# Patient Record
Sex: Male | Born: 1965 | Race: Black or African American | Hispanic: No | Marital: Single | Smoking: Never smoker
Health system: Southern US, Community
[De-identification: ages and names within clinical notes are randomized; demographics above are authoritative.]

## PROBLEM LIST (undated history)

## (undated) DIAGNOSIS — J302 Other seasonal allergic rhinitis: Secondary | ICD-10-CM

## (undated) DIAGNOSIS — T7840XA Allergy, unspecified, initial encounter: Secondary | ICD-10-CM

## (undated) DIAGNOSIS — I10 Essential (primary) hypertension: Secondary | ICD-10-CM

## (undated) DIAGNOSIS — Z789 Other specified health status: Secondary | ICD-10-CM

## (undated) HISTORY — PX: FOOT SURGERY: SHX648

## (undated) HISTORY — PX: NO PAST SURGERIES: SHX2092

## (undated) HISTORY — DX: Allergy, unspecified, initial encounter: T78.40XA

---

## 1998-01-30 ENCOUNTER — Emergency Department (HOSPITAL_COMMUNITY): Admission: EM | Admit: 1998-01-30 | Discharge: 1998-01-30 | Payer: Self-pay | Admitting: Emergency Medicine

## 1998-11-24 ENCOUNTER — Emergency Department (HOSPITAL_COMMUNITY): Admission: EM | Admit: 1998-11-24 | Discharge: 1998-11-24 | Payer: Self-pay | Admitting: *Deleted

## 2003-08-27 ENCOUNTER — Emergency Department (HOSPITAL_COMMUNITY): Admission: EM | Admit: 2003-08-27 | Discharge: 2003-08-27 | Payer: Self-pay | Admitting: Emergency Medicine

## 2004-01-07 ENCOUNTER — Emergency Department (HOSPITAL_COMMUNITY): Admission: EM | Admit: 2004-01-07 | Discharge: 2004-01-07 | Payer: Self-pay | Admitting: *Deleted

## 2004-01-07 ENCOUNTER — Emergency Department (HOSPITAL_COMMUNITY): Admission: EM | Admit: 2004-01-07 | Discharge: 2004-01-07 | Payer: Self-pay | Admitting: Family Medicine

## 2005-04-03 ENCOUNTER — Emergency Department (HOSPITAL_COMMUNITY): Admission: EM | Admit: 2005-04-03 | Discharge: 2005-04-03 | Payer: Self-pay | Admitting: Family Medicine

## 2012-05-18 ENCOUNTER — Emergency Department (HOSPITAL_COMMUNITY)
Admission: EM | Admit: 2012-05-18 | Discharge: 2012-05-18 | Disposition: A | Discharge status: Home / Self Care | Payer: Self-pay | Attending: Emergency Medicine | Admitting: Emergency Medicine

## 2012-05-18 ENCOUNTER — Emergency Department (HOSPITAL_COMMUNITY): Payer: Self-pay

## 2012-05-18 DIAGNOSIS — S91309A Unspecified open wound, unspecified foot, initial encounter: Secondary | ICD-10-CM | POA: Insufficient documentation

## 2012-05-18 DIAGNOSIS — S91329A Laceration with foreign body, unspecified foot, initial encounter: Secondary | ICD-10-CM

## 2012-05-18 DIAGNOSIS — Y9389 Activity, other specified: Secondary | ICD-10-CM | POA: Insufficient documentation

## 2012-05-18 DIAGNOSIS — W268XXA Contact with other sharp object(s), not elsewhere classified, initial encounter: Secondary | ICD-10-CM | POA: Insufficient documentation

## 2012-05-18 DIAGNOSIS — Z23 Encounter for immunization: Secondary | ICD-10-CM | POA: Insufficient documentation

## 2012-05-18 DIAGNOSIS — Y92009 Unspecified place in unspecified non-institutional (private) residence as the place of occurrence of the external cause: Secondary | ICD-10-CM | POA: Insufficient documentation

## 2012-05-18 MED ORDER — OXYCODONE-ACETAMINOPHEN 5-325 MG PO TABS
1.0000 | ORAL_TABLET | Freq: Once | ORAL | Status: AC
  Administered 2012-05-18: 1 via ORAL
  Filled 2012-05-18: qty 1

## 2012-05-18 MED ORDER — TETANUS-DIPHTH-ACELL PERTUSSIS 5-2.5-18.5 LF-MCG/0.5 IM SUSP
0.5000 mL | Freq: Once | INTRAMUSCULAR | Status: AC
  Administered 2012-05-18: 0.5 mL via INTRAMUSCULAR
  Filled 2012-05-18: qty 0.5

## 2012-05-18 MED ORDER — OXYCODONE-ACETAMINOPHEN 5-325 MG PO TABS: 1.0000 | ORAL_TABLET | ORAL | Status: DC | PRN

## 2012-05-18 MED ORDER — CEPHALEXIN 500 MG PO CAPS: 500.0000 mg | ORAL_CAPSULE | Freq: Four times a day (QID) | ORAL | Status: DC

## 2012-05-18 NOTE — ED Notes (Signed)
Ortho tech paged for crutches.  

## 2012-05-18 NOTE — ED Provider Notes (Signed)
History     CSN: [id number]  Arrival date & time 05/18/12  1736   First MD Initiated Contact with Patient 05/18/12 1748      Chief Complaint  Patient presents with  . Foot Injury    (Consider location/radiation/quality/duration/timing/severity/associated sxs/prior treatment) Patient is a 46 y.o. male presenting with foot injury. The history is provided by the patient.  Foot Injury  The incident occurred 1 to 2 hours ago. The incident occurred at home. The injury mechanism was an incision. The pain is present in the left foot. The pain is mild. The pain has been constant since onset. Pertinent negatives include no numbness. Associated symptoms comments: He states he was dreaming while taking a nap and kicked, hitting a glass pane causing laceration to bottom of foot. Marland Kitchen    No past medical history on file.  No past surgical history on file.  No family history on file.  History  Substance Use Topics  . Smoking status: Not on file  . Smokeless tobacco: Not on file  . Alcohol Use: Not on file      Review of Systems  Skin: Positive for wound.  Neurological: Negative for numbness.    Allergies  Review of patient's allergies indicates no known allergies.  Home Medications   Current Outpatient Rx  Name  Route  Sig  Dispense  Refill  . CETIRIZINE HCL 10 MG PO TABS   Oral   Take 10 mg by mouth daily as needed. Allergies           BP 174/91  Pulse 109  Temp 98.5 F (36.9 C) (Oral)  Resp 17  SpO2 98%  Physical Exam  Constitutional: He is oriented to person, place, and time. He appears well-developed and well-nourished. No distress.  Pulmonary/Chest: Effort normal.  Neurological: He is alert and oriented to person, place, and time.  Skin: Skin is warm and dry.       2 cm laceration plantar left foot over 4th MTP. Toes with FROM. No active bleeding. Minimal swelling. No palpable foreign bodies.  Psychiatric: He has a normal mood and affect.    ED Course   Procedures (including critical care time)  Labs Reviewed - No data to display Dg Foot Complete Left  05/18/2012  *RADIOLOGY REPORT*  Clinical Data: Foot laceration.  Trauma.  LEFT FOOT - COMPLETE 3+ VIEW  Comparison: None.  Findings: No fracture.  Anatomic alignment.  Shard of glass or debris are present along the medial aspect of the fourth toe measuring 4 mm and 5 mm each.  Old post-traumatic changes of the medial malleolus.  IMPRESSION: No acute osseous abnormality.  Debris in the medial aspect of the left fourth toe.   Original Report Authenticated By: Andreas Newport, M.D.   LACERATION REPAIR Performed by: Rodena Medin Authorized by: Langley Adie A Consent: Verbal consent obtained. Risks and benefits: risks, benefits and alternatives were discussed Consent given by: patient Patient identity confirmed: provided demographic data Prepped and Draped in normal sterile fashion Wound explored - no foreign bodies visualized, although seen on x-ray.   Laceration Location: left foot, plantar surface  Laceration Length: 3cm  No Foreign Bodies seen or palpated  Anesthesia: local infiltration  Local anesthetic: lidocaine 1% w/o epinephrine  Anesthetic total: 2 ml  Irrigation method: syringe Amount of cleaning: standard  Skin closure: 4-0 prolene  Number of sutures: 2  Technique: simple interrupted, loose closure as there are known retained foreign bodies.   Patient tolerance: Patient tolerated  the procedure well with no immediate complications.    No diagnosis found.  1. Laceration, complicated 2. Foreign body in wound  MDM  FB in wound, not found after exploration by myself and Dr. Preston Fleeting. Loose closure performed, foot wrapped in bulky dressing, crutches given. Refer to ortho        Rodena Medin, PA-C 05/18/12 2052

## 2012-05-18 NOTE — ED Notes (Addendum)
Pt states he was dreaming and kicked a window and cut below his 4th toe on his L foot. Pt arrives with dressing on L foot. Bleeding controlled.

## 2012-05-18 NOTE — ED Provider Notes (Signed)
46 year old male with a laceration of his left foot with the x-ray showing pieces of glass retained. I have probe the wound trying to find the pieces of glass, but have not been able to locate them. We will be closed loosely, he will be started on antibiotics, and he will be referred to orthopedics for exploration to remove the glass pieces.  Medical screening examination/treatment/procedure(s) were conducted as a shared visit with non-physician practitioner(s) and myself.  I personally evaluated the patient during the encounter   Dione Booze, MD 05/18/12 2040

## 2012-05-25 ENCOUNTER — Other Ambulatory Visit (HOSPITAL_COMMUNITY): Payer: Self-pay | Admitting: Orthopaedic Surgery

## 2012-05-31 ENCOUNTER — Encounter (HOSPITAL_COMMUNITY): Payer: Self-pay

## 2012-05-31 MED ORDER — CEFAZOLIN SODIUM-DEXTROSE 2-3 GM-% IV SOLR
2.0000 g | INTRAVENOUS | Status: AC
  Administered 2012-06-01: 2 g via INTRAVENOUS
  Filled 2012-05-31: qty 50

## 2012-06-01 ENCOUNTER — Encounter (HOSPITAL_COMMUNITY): Payer: Self-pay | Admitting: Anesthesiology

## 2012-06-01 ENCOUNTER — Ambulatory Visit (HOSPITAL_COMMUNITY): Payer: Self-pay | Admitting: Anesthesiology

## 2012-06-01 ENCOUNTER — Encounter (HOSPITAL_COMMUNITY)
Admission: RE | Disposition: A | Discharge status: Home / Self Care | Payer: Self-pay | Source: Ambulatory Visit | Attending: Orthopaedic Surgery

## 2012-06-01 ENCOUNTER — Encounter (HOSPITAL_COMMUNITY): Payer: Self-pay | Admitting: *Deleted

## 2012-06-01 ENCOUNTER — Ambulatory Visit (HOSPITAL_COMMUNITY)
Admission: RE | Admit: 2012-06-01 | Discharge: 2012-06-01 | Disposition: A | Discharge status: Home / Self Care | Payer: Self-pay | Source: Ambulatory Visit | Attending: Orthopaedic Surgery | Admitting: Orthopaedic Surgery

## 2012-06-01 DIAGNOSIS — M79609 Pain in unspecified limb: Secondary | ICD-10-CM | POA: Insufficient documentation

## 2012-06-01 DIAGNOSIS — W268XXA Contact with other sharp object(s), not elsewhere classified, initial encounter: Secondary | ICD-10-CM | POA: Insufficient documentation

## 2012-06-01 DIAGNOSIS — S90852A Superficial foreign body, left foot, initial encounter: Secondary | ICD-10-CM

## 2012-06-01 DIAGNOSIS — S91309A Unspecified open wound, unspecified foot, initial encounter: Secondary | ICD-10-CM | POA: Insufficient documentation

## 2012-06-01 HISTORY — PX: I & D EXTREMITY: SHX5045

## 2012-06-01 HISTORY — DX: Other specified health status: Z78.9

## 2012-06-01 HISTORY — DX: Other seasonal allergic rhinitis: J30.2

## 2012-06-01 LAB — CBC
MCV: 94.7 fL (ref 78.0–100.0)
Platelets: 251 10*3/uL (ref 150–400)
RBC: 4.53 MIL/uL (ref 4.22–5.81)
WBC: 7.2 10*3/uL (ref 4.0–10.5)

## 2012-06-01 LAB — SURGICAL PCR SCREEN: MRSA, PCR: NEGATIVE

## 2012-06-01 SURGERY — IRRIGATION AND DEBRIDEMENT EXTREMITY
Anesthesia: Monitor Anesthesia Care | Site: Foot | Laterality: Left | Wound class: Clean

## 2012-06-01 MED ORDER — FENTANYL CITRATE 0.05 MG/ML IJ SOLN
INTRAMUSCULAR | Status: DC | PRN
  Administered 2012-06-01 (×5): 50 ug via INTRAVENOUS

## 2012-06-01 MED ORDER — MIDAZOLAM HCL 5 MG/5ML IJ SOLN
INTRAMUSCULAR | Status: DC | PRN
  Administered 2012-06-01: 2 mg via INTRAVENOUS

## 2012-06-01 MED ORDER — MUPIROCIN 2 % EX OINT: TOPICAL_OINTMENT | Freq: Two times a day (BID) | CUTANEOUS | Status: DC

## 2012-06-01 MED ORDER — LACTATED RINGERS IV SOLN
INTRAVENOUS | Status: DC | PRN
  Administered 2012-06-01: 16:00:00 via INTRAVENOUS

## 2012-06-01 MED ORDER — LACTATED RINGERS IV SOLN
INTRAVENOUS | Status: DC
  Administered 2012-06-01: 15:00:00 via INTRAVENOUS

## 2012-06-01 MED ORDER — MUPIROCIN 2 % EX OINT
TOPICAL_OINTMENT | CUTANEOUS | Status: AC
  Administered 2012-06-01: 1 via NASAL
  Filled 2012-06-01: qty 22

## 2012-06-01 MED ORDER — PROPOFOL INFUSION 10 MG/ML OPTIME
INTRAVENOUS | Status: DC | PRN
  Administered 2012-06-01: 100 ug/kg/min via INTRAVENOUS

## 2012-06-01 MED ORDER — HYDROMORPHONE HCL PF 1 MG/ML IJ SOLN: 0.2500 mg | INTRAMUSCULAR | Status: DC | PRN

## 2012-06-01 MED ORDER — ONDANSETRON HCL 4 MG/2ML IJ SOLN: 4.0000 mg | Freq: Once | INTRAMUSCULAR | Status: DC | PRN

## 2012-06-01 MED ORDER — SODIUM CHLORIDE 0.9 % IR SOLN
Status: DC | PRN
  Administered 2012-06-01: 1000 mL

## 2012-06-01 MED ORDER — OXYCODONE-ACETAMINOPHEN 5-325 MG PO TABS: 1.0000 | ORAL_TABLET | ORAL | Status: DC | PRN

## 2012-06-01 MED ORDER — LIDOCAINE HCL (CARDIAC) 20 MG/ML IV SOLN
INTRAVENOUS | Status: DC | PRN
  Administered 2012-06-01: 30 mg via INTRAVENOUS

## 2012-06-01 SURGICAL SUPPLY — 55 items
BANDAGE CONFORM 3  STR LF (GAUZE/BANDAGES/DRESSINGS) IMPLANT
BANDAGE ELASTIC 3 VELCRO ST LF (GAUZE/BANDAGES/DRESSINGS) IMPLANT
BANDAGE GAUZE ELAST BULKY 4 IN (GAUZE/BANDAGES/DRESSINGS) ×2 IMPLANT
BLADE SURG 10 STRL SS (BLADE) ×2 IMPLANT
BNDG COHESIVE 1X5 TAN STRL LF (GAUZE/BANDAGES/DRESSINGS) IMPLANT
BNDG COHESIVE 4X5 TAN STRL (GAUZE/BANDAGES/DRESSINGS) ×2 IMPLANT
BNDG COHESIVE 6X5 TAN STRL LF (GAUZE/BANDAGES/DRESSINGS) ×4 IMPLANT
BNDG GAUZE STRTCH 6 (GAUZE/BANDAGES/DRESSINGS) ×6 IMPLANT
CLOTH BEACON ORANGE TIMEOUT ST (SAFETY) ×2 IMPLANT
CORDS BIPOLAR (ELECTRODE) IMPLANT
COVER SURGICAL LIGHT HANDLE (MISCELLANEOUS) ×2 IMPLANT
CUFF TOURNIQUET SINGLE 18IN (TOURNIQUET CUFF) ×2 IMPLANT
CUFF TOURNIQUET SINGLE 24IN (TOURNIQUET CUFF) IMPLANT
CUFF TOURNIQUET SINGLE 34IN LL (TOURNIQUET CUFF) IMPLANT
CUFF TOURNIQUET SINGLE 44IN (TOURNIQUET CUFF) IMPLANT
DRAPE ORTHO SPLIT 77X108 STRL (DRAPES) ×4
DRAPE SURG 17X23 STRL (DRAPES) IMPLANT
DRAPE SURG ORHT 6 SPLT 77X108 (DRAPES) ×2 IMPLANT
DRAPE U-SHAPE 47X51 STRL (DRAPES) ×2 IMPLANT
DURAPREP 26ML APPLICATOR (WOUND CARE) ×2 IMPLANT
ELECT CAUTERY BLADE 6.4 (BLADE) IMPLANT
ELECT REM PT RETURN 9FT ADLT (ELECTROSURGICAL)
ELECTRODE REM PT RTRN 9FT ADLT (ELECTROSURGICAL) IMPLANT
GAUZE XEROFORM 1X8 LF (GAUZE/BANDAGES/DRESSINGS) ×2 IMPLANT
GLOVE BIOGEL PI IND STRL 8 (GLOVE) ×2 IMPLANT
GLOVE BIOGEL PI INDICATOR 8 (GLOVE) ×2
GLOVE ORTHO TXT STRL SZ7.5 (GLOVE) ×2 IMPLANT
GOWN PREVENTION PLUS LG XLONG (DISPOSABLE) IMPLANT
GOWN PREVENTION PLUS XLARGE (GOWN DISPOSABLE) ×2 IMPLANT
GOWN STRL NON-REIN LRG LVL3 (GOWN DISPOSABLE) ×2 IMPLANT
HANDPIECE INTERPULSE COAX TIP (DISPOSABLE)
KIT BASIN OR (CUSTOM PROCEDURE TRAY) ×2 IMPLANT
KIT ROOM TURNOVER OR (KITS) ×2 IMPLANT
MANIFOLD NEPTUNE II (INSTRUMENTS) ×2 IMPLANT
NS IRRIG 1000ML POUR BTL (IV SOLUTION) ×2 IMPLANT
PACK ORTHO EXTREMITY (CUSTOM PROCEDURE TRAY) ×2 IMPLANT
PAD ARMBOARD 7.5X6 YLW CONV (MISCELLANEOUS) ×4 IMPLANT
PADDING CAST ABS 4INX4YD NS (CAST SUPPLIES) ×2
PADDING CAST ABS COTTON 4X4 ST (CAST SUPPLIES) ×2 IMPLANT
PADDING CAST COTTON 6X4 STRL (CAST SUPPLIES) ×2 IMPLANT
SET HNDPC FAN SPRY TIP SCT (DISPOSABLE) IMPLANT
SPONGE GAUZE 4X4 12PLY (GAUZE/BANDAGES/DRESSINGS) ×2 IMPLANT
SPONGE LAP 18X18 X RAY DECT (DISPOSABLE) ×2 IMPLANT
STOCKINETTE IMPERVIOUS 9X36 MD (GAUZE/BANDAGES/DRESSINGS) ×2 IMPLANT
SUT ETHILON 2 0 FS 18 (SUTURE) ×6 IMPLANT
SUT ETHILON 3 0 PS 1 (SUTURE) ×4 IMPLANT
SYR CONTROL 10ML LL (SYRINGE) IMPLANT
TOWEL OR 17X24 6PK STRL BLUE (TOWEL DISPOSABLE) ×2 IMPLANT
TOWEL OR 17X26 10 PK STRL BLUE (TOWEL DISPOSABLE) ×2 IMPLANT
TUBE ANAEROBIC SPECIMEN COL (MISCELLANEOUS) IMPLANT
TUBE CONNECTING 12X1/4 (SUCTIONS) ×2 IMPLANT
TUBE FEEDING 5FR 15 INCH (TUBING) IMPLANT
UNDERPAD 30X30 INCONTINENT (UNDERPADS AND DIAPERS) ×2 IMPLANT
WATER STERILE IRR 1000ML POUR (IV SOLUTION) ×2 IMPLANT
YANKAUER SUCT BULB TIP NO VENT (SUCTIONS) ×2 IMPLANT

## 2012-06-01 NOTE — Transfer of Care (Signed)
Immediate Anesthesia Transfer of Care Note  Patient: Jeremy Marsh  Procedure(s) Performed: Procedure(s) (LRB) with comments: IRRIGATION AND DEBRIDEMENT EXTREMITY (Left) - Removal of glass left foot  Patient Location: PACU  Anesthesia Type:MAC and Regional  Level of Consciousness: awake, alert  and oriented  Airway & Oxygen Therapy: Patient Spontanous Breathing and Patient connected to nasal cannula oxygen  Post-op Assessment: Report given to PACU RN, Post -op Vital signs reviewed and stable and Patient moving all extremities  Post vital signs: Reviewed and stable  Complications: No apparent anesthesia complications

## 2012-06-01 NOTE — Anesthesia Preprocedure Evaluation (Addendum)
Anesthesia Evaluation  Patient identified by MRN, date of birth, ID band Patient awake    Reviewed: Allergy & Precautions, H&P , NPO status , Patient's Chart, lab work & pertinent test results  Airway Mallampati: II TM Distance: >3 FB Neck ROM: Full    Dental  (+) Teeth Intact Prominent front teeth.:   Pulmonary          Cardiovascular Rhythm:regular Rate:Normal     Neuro/Psych    GI/Hepatic   Endo/Other    Renal/GU      Musculoskeletal   Abdominal   Peds  Hematology   Anesthesia Other Findings   Reproductive/Obstetrics                          Anesthesia Physical Anesthesia Plan  ASA: II  Anesthesia Plan: MAC and Regional   Post-op Pain Management: MAC Combined w/ Regional for Post-op pain   Induction: Intravenous  Airway Management Planned: Simple Face Mask  Additional Equipment:   Intra-op Plan:   Post-operative Plan:   Informed Consent: I have reviewed the patients History and Physical, chart, labs and discussed the procedure including the risks, benefits and alternatives for the proposed anesthesia with the patient or authorized representative who has indicated his/her understanding and acceptance.   Dental advisory given  Plan Discussed with: CRNA, Anesthesiologist and Surgeon  Anesthesia Plan Comments:        Anesthesia Quick Evaluation

## 2012-06-01 NOTE — H&P (Signed)
Jeremy Marsh is an 47 y.o. male.   Chief Complaint:  Retained glass in left foot HPI:   48 yo male who accidentally kicked a window with his left foot while asleep when he was dreaming.  Sustained a laceration to his left foot between his 3rd and 4th toes.  X-rays show 2 glass fragments still in his left foot near the 4th toe.  He wishes to have these removed.  Past Medical History  Diagnosis Date  . Seasonal allergies   . No pertinent past medical history     Past Surgical History  Procedure Date  . No past surgeries     History reviewed. No pertinent family history. Social History:  reports that he has never smoked. He does not have any smokeless tobacco history on file. He reports that he drinks alcohol. He reports that he does not use illicit drugs.  Allergies: No Known Allergies  Medications Prior to Admission  Medication Sig Dispense Refill  . cephALEXin (KEFLEX) 500 MG capsule Take 1 capsule (500 mg total) by mouth 4 (four) times daily.  20 capsule  0  . oxyCODONE-acetaminophen (PERCOCET/ROXICET) 5-325 MG per tablet Take 1-2 tablets by mouth every 4 (four) hours as needed for pain.  15 tablet  0  . cetirizine (ZYRTEC) 10 MG tablet Take 10 mg by mouth daily as needed. Allergies        Results for orders placed during the hospital encounter of 06/01/12 (from the past 48 hour(s))  CBC     Status: Normal   Collection Time   06/01/12  1:42 PM      Component Value Range Comment   WBC 7.2  4.0 - 10.5 K/uL    RBC 4.53  4.22 - 5.81 MIL/uL    Hemoglobin 14.5  13.0 - 17.0 g/dL    HCT 78.2  95.6 - 21.3 %    MCV 94.7  78.0 - 100.0 fL    MCH 32.0  26.0 - 34.0 pg    MCHC 33.8  30.0 - 36.0 g/dL    RDW 08.6  57.8 - 46.9 %    Platelets 251  150 - 400 K/uL    No results found.  Review of Systems  All other systems reviewed and are negative.    Blood pressure 141/96, pulse 91, temperature 98.3 F (36.8 C), temperature source Oral, resp. rate 20, height 6' 1.5" (1.867 m), weight  112.4 kg (247 lb 12.8 oz), SpO2 97.00%. Physical Exam  Constitutional: He is oriented to person, place, and time. He appears well-developed and well-nourished.  HENT:  Head: Normocephalic and atraumatic.  Eyes: EOM are normal. Pupils are equal, round, and reactive to light.  Neck: Normal range of motion. Neck supple.  Cardiovascular: Normal rate and regular rhythm.   Respiratory: Effort normal and breath sounds normal.  GI: Soft. Bowel sounds are normal.  Musculoskeletal:       Feet:  Neurological: He is alert and oriented to person, place, and time.  Skin: Skin is warm and dry.  Psychiatric: He has a normal mood and affect.     Assessment/Plan Retained small glass pieces in left foot near 4th toe 1) to the OR as an outpatient for removal of the retained foreign bodies from his left foot as an outpatient  Jeremy Marsh Y 06/01/2012, 2:35 PM

## 2012-06-01 NOTE — Anesthesia Procedure Notes (Signed)
Anesthesia Regional Block:  Ankle block  Pre-Anesthetic Checklist: ,, timeout performed, Correct Patient, Correct Site, Correct Laterality, Correct Procedure, Correct Position, site marked, Risks and benefits discussed,  Surgical consent,  Pre-op evaluation,  At surgeon's request and post-op pain management  Laterality: Left  Prep: Maximum Sterile Barrier Precautions used, chloraprep and alcohol swabs       Needles:  Injection technique: Single-shot      Needle Gauge: 25 and 25 G  Needle insertion depth: 4 cm   Additional Needles: Ankle block Narrative:  Start time: 06/01/2012 3:30 PM End time: 06/01/2012 3:40 PM Injection made incrementally with aspirations every 5 mL.  Performed by: Personally  Anesthesiologist: Maren Beach MD  Additional Notes: Pt accept procedure and risks. 40cc ( 20cc 2% Lidocaine and 20cc 0.5% Marcaine ) w/o difficulty. Ant/Post Tibial and Sural N .  GES

## 2012-06-01 NOTE — Brief Op Note (Signed)
06/01/2012  4:40 PM  PATIENT:  Jeremy Marsh  47 y.o. male  PRE-OPERATIVE DIAGNOSIS:  Foreign body (glass) left foot  POST-OPERATIVE DIAGNOSIS: No Foreign body (glass) left foot  PROCEDURE:  Exploration of left foot wound  SURGEON:  Surgeon(s) and Role:    * Kathryne Hitch, MD - Primary  PHYSICIAN ASSISTANT:   ASSISTANTS: none   ANESTHESIA:   regional  EBL:  Total I/O In: 600 [I.V.:600] Out: -   BLOOD ADMINISTERED:none  DRAINS: none   LOCAL MEDICATIONS USED:  NONE  SPECIMEN:  No Specimen  DISPOSITION OF SPECIMEN:  N/A  COUNTS:  YES  TOURNIQUET:  * No tourniquets in log *  DICTATION: .Other Dictation: Dictation Number [id number]  PLAN OF CARE: Discharge to home after PACU  PATIENT DISPOSITION:  PACU - hemodynamically stable.   Delay start of Pharmacological VTE agent (>24hrs) due to surgical blood loss or risk of bleeding: not applicable

## 2012-06-01 NOTE — Anesthesia Postprocedure Evaluation (Signed)
  Anesthesia Post-op Note  Patient: Jeremy Marsh  Procedure(s) Performed: Procedure(s) (LRB) with comments: IRRIGATION AND DEBRIDEMENT EXTREMITY (Left) - Removal of glass left foot  Patient Location: PACU  Anesthesia Type:Regional  Level of Consciousness: awake, alert , oriented and patient cooperative  Airway and Oxygen Therapy: Patient Spontanous Breathing  Post-op Pain: none  Post-op Assessment: Post-op Vital signs reviewed, Patient's Cardiovascular Status Stable, Respiratory Function Stable, Patent Airway, No signs of Nausea or vomiting and Pain level controlled  Post-op Vital Signs: stable  Complications: No apparent anesthesia complications

## 2012-06-02 ENCOUNTER — Encounter (HOSPITAL_COMMUNITY): Payer: Self-pay | Admitting: Orthopaedic Surgery

## 2012-06-02 MED ORDER — LIDOCAINE HCL (PF) 2 % IJ SOLN
INTRAMUSCULAR | Status: DC | PRN
  Administered 2012-06-01: 20 mL

## 2012-06-02 MED ORDER — BUPIVACAINE HCL (PF) 0.5 % IJ SOLN
INTRAMUSCULAR | Status: DC | PRN
  Administered 2012-06-01: 20 mL

## 2012-06-02 NOTE — Op Note (Signed)
[name], Jeremy Marsh               ACCOUNT NO.:  [id number]  MEDICAL RECORD NO.:  [id number]  LOCATION:  [facility]                         FACILITY:  [facility]  PHYSICIAN:  Vanita Panda. Magnus Ivan, M.D.DATE OF BIRTH:  [birth year]  DATE OF PROCEDURE: DATE OF DISCHARGE:  06/01/2012                              OPERATIVE REPORT   PREOPERATIVE DIAGNOSIS:  Left foot wound with retained glass.  POSTOPERATIVE DIAGNOSIS:  Left foot wound.  PROCEDURE:  FINDINGS:  Explore the wound within and unable to find retained glass and no x-ray evidence under fluoroscopic guidance of glass.  SURGEON:  Vanita Panda. Magnus Ivan, M.D.  ANESTHESIA:  Local left ankle block.  BLOOD LOSS:  Minimal.  COMPLICATIONS:  None.  INDICATIONS:  [name] is a 47 year old gentleman who had some type of nightmare when he was sleeping and kicked the glass window.  He was seen a few weeks ago in the emergency room, and had a laceration found between his 3rd and 4th toes.  X-ray showed retained glass near the 4th toe and apparently the ER was unable to find the glass, per the patient and their notes and irrigated the wound and placed a single suture.  He then came to my office, I could not fill any glass.  He is having a lot of pain.  I recommended he just let the wound to heal and leave the glass in place because it looked like it was rounded off.  He did not like this and wished to have the glass removed.  In total, this would be quite difficult to do this, except done under operative environment and I have given no guarantees that we could find the glass and the potential of causing more damage and even infection to his foot.  He did wish to proceed with surgery.  PROCEDURE DESCRIPTION:  After informed consent was obtained, appropriate left foot was marked.  Anesthesia obtained to a local ankle block.  He was brought to the operating room, placed supine on the operating table. His left foot was then prepped and draped  with DuraPrep and sterile drapes.  Time-out was called and identified the correct patient, correct left foot.  I then removed 1 suture that was in the foot just on the plantar surface between the 3rd and 4th toes and open of the wound in its entirety between the 3rd and 4th toes.  I explored the wound under loupe optic guidance and even using the mini C-arm unit and I could not find any glass at all and under direct fluoroscopy I did not see any shards of glass under multiple views.  I then thoroughly irrigated the wounds and closed the incision with interrupted 3-0 nylon suture.  I placed Xeroform well-padded sterile dressing.  He was taken to recovery room in stable condition. All final counts were correct.  There were no complications noted.     Vanita Panda. Magnus Ivan, M.D.     CYB/MEDQ  D:  06/01/2012  T:  06/02/2012  Job:  [id number]

## 2012-06-02 NOTE — Addendum Note (Signed)
Addendum  created 06/02/12 1207 by Adair Laundry, CRNA   Modules edited:Anesthesia Flowsheet, Anesthesia Medication Administration, Charges VN

## 2012-06-02 NOTE — Addendum Note (Signed)
Addendum  created 06/02/12 1207 by Adair Laundry, CRNA   Modules edited:Anesthesia Medication Administration, Charges VN

## 2014-08-03 ENCOUNTER — Ambulatory Visit (INDEPENDENT_AMBULATORY_CARE_PROVIDER_SITE_OTHER): Payer: Self-pay | Admitting: Family Medicine

## 2014-08-03 VITALS — BP 124/80 | HR 87 | Temp 98.2°F | Resp 18 | Ht 71.5 in | Wt 267.0 lb

## 2014-08-03 DIAGNOSIS — R21 Rash and other nonspecific skin eruption: Secondary | ICD-10-CM

## 2014-08-03 LAB — POCT CBC
GRANULOCYTE PERCENT: 65.7 % (ref 37–80)
HEMATOCRIT: 44.7 % (ref 43.5–53.7)
HEMOGLOBIN: 14.2 g/dL (ref 14.1–18.1)
LYMPH, POC: 2.5 (ref 0.6–3.4)
MCH: 30.6 pg (ref 27–31.2)
MCHC: 31.8 g/dL (ref 31.8–35.4)
MCV: 96.1 fL (ref 80–97)
MID (CBC): 0.1 (ref 0–0.9)
MPV: 8.9 fL (ref 0–99.8)
PLATELET COUNT, POC: 236 10*3/uL (ref 142–424)
POC Granulocyte: 5.1 (ref 2–6.9)
POC LYMPH PERCENT: 32.4 %L (ref 10–50)
POC MID %: 1.9 % (ref 0–12)
RBC: 4.65 M/uL — AB (ref 4.69–6.13)
RDW, POC: 13.8 %
WBC: 7.7 10*3/uL (ref 4.6–10.2)

## 2014-08-03 MED ORDER — PREDNISONE 20 MG PO TABS: ORAL_TABLET | ORAL | Status: DC

## 2014-08-03 NOTE — Progress Notes (Signed)
MRN: [id number] DOB: 1966-01-30  Subjective:   Jeremy Marsh is a 49 y.o. male presenting for chief complaint of Rash  Reports 5 days history of rash throughout his body. Rash started on his back and has spread to neck, torso, upper arms, thighs. Rash is mildly itchy. No one else at home has similar rash. Had an insect bite to back of his head ~1 week ago while outside, now resolved after expressing it. Denies fevers, oral lesions, rash on palms or soles of feet, drainage of pus or blood, oozing, pain, numbness or tingling, chest tightness, shob, wheezing, facial swelling, oral swelling, throat closing. Also denies itchy or watery eyes, sinus pain, sinus congestion, throat pain, cough. Has not come into contact with poisonous plants, spent time outdoors camping. Has a history of hives in 2015, resolved with hydrocortisone cream. No smoking, 1-2 alcohol drinks each week. Denies any other aggravating or relieving factors, no other questions or concerns.  Tyde has a current medication list which includes the following prescription(s): cetirizine. He has No Known Allergies.  Telesforo  has a past medical history of Seasonal allergies; No pertinent past medical history; and Allergy. Also  has past surgical history that includes No past surgeries; I&D extremity (06/01/2012); and Foot surgery.  ROS As in subjective.  Objective:   Vitals: BP 124/80 mmHg  Pulse 87  Temp(Src) 98.2 F (36.8 C) (Oral)  Resp 18  Ht 5' 11.5" (1.816 m)  Wt 267 lb (121.11 kg)  BMI 36.72 kg/m2  SpO2 99%  Physical Exam  Constitutional: He is oriented to person, place, and time and well-developed, well-nourished, and in no distress.  HENT:  No oral lesions. Mucous membranes pink and moist.  Eyes: Conjunctivae are normal. Right eye exhibits no discharge. Left eye exhibits no discharge. No scleral icterus.  Cardiovascular: Normal rate.   Pulmonary/Chest: Effort normal.  Neurological: He is alert and oriented to  person, place, and time.  Skin: Skin is warm and dry. Rash (diffuse papular rash with many follicular/small pustules worst over back with multiple patches, rash is also over upper extremities, thighs, neck, a few lesions over face) noted. No erythema.   Results for orders placed or performed in visit on 08/03/14 (from the past 24 hour(s))  POCT CBC     Status: Abnormal   Collection Time: 08/03/14  5:35 PM  Result Value Ref Range   WBC 7.7 4.6 - 10.2 K/uL   Lymph, poc 2.5 0.6 - 3.4   POC LYMPH PERCENT 32.4 10 - 50 %L   MID (cbc) 0.1 0 - 0.9   POC MID % 1.9 0 - 12 %M   POC Granulocyte 5.1 2 - 6.9   Granulocyte percent 65.7 37 - 80 %G   RBC 4.65 (A) 4.69 - 6.13 M/uL   Hemoglobin 14.2 14.1 - 18.1 g/dL   HCT, POC 40.9 81.1 - 53.7 %   MCV 96.1 80 - 97 fL   MCH, POC 30.6 27 - 31.2 pg   MCHC 31.8 31.8 - 35.4 g/dL   RDW, POC 91.4 %   Platelet Count, POC 236 142 - 424 K/uL   MPV 8.9 0 - 99.8 fL   Assessment and Plan :   1. Rash and nonspecific skin eruption - Unclear etiology, cbc reassuring for ruling out infectious process, will try a steroid course for 5 days. - Counseled on worsening symptoms and advised to return to clinic if facial swelling, shob, wheezing develops  Wallis Bamberg, PA-C [facility] [phone] 08/03/2014 5:08 PM

## 2014-08-03 NOTE — Progress Notes (Signed)
Discussed the patient with Wallis BambergMario Mani PA-C.  I examined also. The rash has a few areas that look like welt formation. However most of the rash is very fine follicular looking spots. This is diffuse on the trunk where it started, but also has on the face. There is a little black. On the right side of the for head is a whelp like bump about a centimeter in diameter. Rash extends on both extremities, not on the palms. No interdigital lesions. This is not pruritic. There are no oral lesions. Agree that this is an atypical rash. Do not believe it is a major bacterial infection, and the CBC is normal. It could represent a viral exanthem though it could represent a allergic process with itching in by having taken an antihistamine. Will give her a brief burst of steroids and see him back in all worse. Cautioned about worsening allergic reaction type symptoms. PA treatment and plan agreed upon.

## 2014-08-03 NOTE — Patient Instructions (Signed)

## 2015-10-08 ENCOUNTER — Telehealth: Payer: Self-pay

## 2015-10-09 NOTE — Telephone Encounter (Signed)
ERROR

## 2016-02-11 ENCOUNTER — Ambulatory Visit (INDEPENDENT_AMBULATORY_CARE_PROVIDER_SITE_OTHER): Payer: Self-pay | Admitting: Urgent Care

## 2016-02-11 VITALS — BP 152/98 | HR 94 | Temp 98.3°F | Resp 17 | Ht 71.5 in | Wt 264.0 lb

## 2016-02-11 DIAGNOSIS — H109 Unspecified conjunctivitis: Secondary | ICD-10-CM

## 2016-02-11 DIAGNOSIS — H1089 Other conjunctivitis: Secondary | ICD-10-CM

## 2016-02-11 DIAGNOSIS — A499 Bacterial infection, unspecified: Secondary | ICD-10-CM

## 2016-02-11 DIAGNOSIS — R030 Elevated blood-pressure reading, without diagnosis of hypertension: Secondary | ICD-10-CM

## 2016-02-11 DIAGNOSIS — H578 Other specified disorders of eye and adnexa: Secondary | ICD-10-CM

## 2016-02-11 DIAGNOSIS — H5789 Other specified disorders of eye and adnexa: Secondary | ICD-10-CM

## 2016-02-11 DIAGNOSIS — I10 Essential (primary) hypertension: Secondary | ICD-10-CM

## 2016-02-11 MED ORDER — ERYTHROMYCIN 5 MG/GM OP OINT: 1.0000 "application " | TOPICAL_OINTMENT | Freq: Four times a day (QID) | OPHTHALMIC | 0 refills | Status: AC

## 2016-02-11 NOTE — Progress Notes (Signed)
    MRN: [id number] DOB: 09/19/1965  Subjective:   Jeremy Marsh is a 50 y.o. male presenting for chief complaint of Eye Burn (Right. Since Saturday. )  Reports 4 day history of right red eye. Eye is also itching, burning. Continues to rub his eye. His vision has generally been blurry in his left but not his right eye. He has been working with his PCP and ophthalmologist for blurred vision and HTN. Denies fever, eye trauma, photosensitivity, pain with eye movement, sinus pain, congestion, ear pain, ear drainage, cough, sore throat. 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 Denies wearing glasses or contacts.  [name] has a current medication list which includes the following prescription(s): amlodipine, cetirizine, and prednisone. Also has No Known Allergies.  [name]  has a past medical history of Allergy; No pertinent past medical history; and Seasonal allergies. Also  has a past surgical history that includes No past surgeries; I&D extremity (06/01/2012); and Foot surgery.  Objective:   Vitals: BP (!) 152/98 (BP Location: Right Arm, Patient Position: Sitting, Cuff Size: Large)   Pulse 94   Temp 98.3 F (36.8 C) (Oral)   Resp 17   Ht 5' 11.5" (1.816 m)   Wt 264 lb (119.7 kg)   SpO2 97%   BMI 36.31 kg/m    Visual Acuity Screening   Right eye Left eye Both eyes  Without correction: unable to see  20/40 20/40  With correction:       Physical Exam  Constitutional: He is oriented to person, place, and time. He appears well-developed and well-nourished.  HENT:  TM's intact bilaterally, no effusions or erythema. Nasal turbinates pink and moist, nasal passages patent. No sinus tenderness. Oropharynx clear, mucous membranes moist, dentition in good repair.  Eyes: EOM are normal. Pupils are equal, round, and reactive to light. Lids are everted and swept, no foreign bodies found. Right eye exhibits discharge. Right eye exhibits no chemosis, no exudate and  no hordeolum. No foreign body present in the right eye. Left eye exhibits no chemosis, no discharge, no exudate and no hordeolum. No foreign body present in the left eye. Right conjunctiva is injected. Right conjunctiva has no hemorrhage. Left conjunctiva is not injected. Left conjunctiva has no hemorrhage. No scleral icterus.  Neck: Normal range of motion. Neck supple.  Cardiovascular: Normal rate.   Pulmonary/Chest: Effort normal.  Lymphadenopathy:    He has no cervical adenopathy.  Neurological: He is alert and oriented to person, place, and time.  Skin: Skin is warm and dry.   Assessment and Plan :   1. Bacterial conjunctivitis of right eye 2. Redness of right eye - Start erythromycin ophthalmic QID, f/u in 2 days. Symptoms warranting recheck/rtc reviewed. Patient declined antihistamine eye drops for itching. He will continue to take allergy medication.  3. Elevated blood pressure reading 4. Essential hypertension - Patient states that he will set up f/u with his PCP for continued management of his HTN  Jeremy BambergMario Nahia Nissan, PA-C [facility] [phone] 02/11/2016 1:52 PM

## 2016-02-11 NOTE — Patient Instructions (Addendum)
Bacterial Conjunctivitis Bacterial conjunctivitis, commonly called pink eye, is an inflammation of the clear membrane that covers the white part of the eye (conjunctiva). The inflammation can also happen on the underside of the eyelids. The blood vessels in the conjunctiva become inflamed, causing the eye to become red or pink. Bacterial conjunctivitis may spread easily from one eye to another and from person to person (contagious).  CAUSES  Bacterial conjunctivitis is caused by bacteria. The bacteria may come from your own skin, your upper respiratory tract, or from someone else with bacterial conjunctivitis. SYMPTOMS  The normally white color of the eye or the underside of the eyelid is usually pink or red. The pink eye is usually associated with irritation, tearing, and some sensitivity to light. Bacterial conjunctivitis is often associated with a thick, yellowish discharge from the eye. The discharge may turn into a crust on the eyelids overnight, which causes your eyelids to stick together. If a discharge is present, there may also be some blurred vision in the affected eye. DIAGNOSIS  Bacterial conjunctivitis is diagnosed by your caregiver through an eye exam and the symptoms that you report. Your caregiver looks for changes in the surface tissues of your eyes, which may point to the specific type of conjunctivitis. A sample of any discharge may be collected on a cotton-tip swab if you have a severe case of conjunctivitis, if your cornea is affected, or if you keep getting repeat infections that do not respond to treatment. The sample will be sent to a lab to see if the inflammation is caused by a bacterial infection and to see if the infection will respond to antibiotic medicines. TREATMENT   Bacterial conjunctivitis is treated with antibiotics. Antibiotic eyedrops are most often used. However, antibiotic ointments are also available. Antibiotics pills are sometimes used. Artificial tears or eye  washes may ease discomfort. HOME CARE INSTRUCTIONS   To ease discomfort, apply a cool, clean washcloth to your eye for 10-20 minutes, 3-4 times a day.  Gently wipe away any drainage from your eye with a warm, wet washcloth or a cotton ball.  Wash your hands often with soap and water. Use paper towels to dry your hands.  Do not share towels or washcloths. This may spread the infection.  Change or wash your pillowcase every day.  You should not use eye makeup until the infection is gone.  Do not operate machinery or drive if your vision is blurred.  Stop using contact lenses. Ask your caregiver how to sterilize or replace your contacts before using them again. This depends on the type of contact lenses that you use.  When applying medicine to the infected eye, do not touch the edge of your eyelid with the eyedrop bottle or ointment tube. SEEK IMMEDIATE MEDICAL CARE IF:   Your infection has not improved within 3 days after beginning treatment.  You had yellow discharge from your eye and it returns.  You have increased eye pain.  Your eye redness is spreading.  Your vision becomes blurred.  You have a fever or persistent symptoms for more than 2-3 days.  You have a fever and your symptoms suddenly get worse.  You have facial pain, redness, or swelling. MAKE SURE YOU:   Understand these instructions.  Will watch your condition.  Will get help right away if you are not doing well or get worse.   This information is not intended to replace advice given to you by your health care provider. Make sure you   discuss any questions you have with your health care provider.   Document Released: 05/12/2005 Document Revised: 06/02/2014 Document Reviewed: 10/13/2011 Elsevier Interactive Patient Education 2016 [employer]     IF you received an x-ray today, you will receive an invoice from [facility]. Please contact [facility] at [phone] with questions or  concerns regarding your invoice.   IF you received labwork today, you will receive an invoice from [facility]. Please contact [facility] at [phone] with questions or concerns regarding your invoice.   Our billing staff will not be able to assist you with questions regarding bills from these companies.  You will be contacted with the lab results as soon as they are available. The fastest way to get your results is to activate your My Chart account. Instructions are located on the last page of this paperwork. If you have not heard from us regarding the results in 2 weeks, please contact this office.      

## 2016-08-18 ENCOUNTER — Telehealth: Payer: Self-pay | Admitting: General Practice

## 2016-08-18 NOTE — Telephone Encounter (Signed)
Patient was seen by Rachel on Friday for passing out spells.  Patient called regarding the scan he had Friday which showed mild concussion from the fall.  They did not address his choking symptoms at all.  He is wanting to discuss this or have someone refer him to the GI specialist . °  °Thanks °  °[phone]  °

## 2016-08-18 NOTE — Telephone Encounter (Signed)
ERROR wrong patient disregard

## 2017-11-11 ENCOUNTER — Encounter (HOSPITAL_COMMUNITY): Payer: Self-pay | Admitting: Emergency Medicine

## 2017-11-11 ENCOUNTER — Emergency Department (HOSPITAL_COMMUNITY)
Admission: EM | Admit: 2017-11-11 | Discharge: 2017-11-12 | Disposition: A | Discharge status: Home / Self Care | Payer: Self-pay | Attending: Emergency Medicine | Admitting: Emergency Medicine

## 2017-11-11 DIAGNOSIS — M79674 Pain in right toe(s): Secondary | ICD-10-CM | POA: Insufficient documentation

## 2017-11-11 DIAGNOSIS — Z7982 Long term (current) use of aspirin: Secondary | ICD-10-CM | POA: Insufficient documentation

## 2017-11-11 DIAGNOSIS — Z79899 Other long term (current) drug therapy: Secondary | ICD-10-CM | POA: Insufficient documentation

## 2017-11-11 NOTE — ED Provider Notes (Addendum)
[facility]-EMERGENCY DEPT Provider Note   CSN: [id number] Arrival date & time: 11/11/17  2237     History   Chief Complaint Chief Complaint  Patient presents with  . Toe Pain    HPI Jeremy Marsh is a 52 y.o. male.  HPI 52 year old African-American male with no pertinent past medical history presents to the emergency department today for evaluation of right fifth toe pain.  Patient states that he stepped his toe 3 weeks ago on a bedpost.  Patient states that since then has had constant pain with ambulation and palpation to the area.  Patient is taken 200 mg ibuprofen occasionally with only minimal relief.  Patient reports associated swelling to the area.  Patient is able to ambulate.  Denies any other associated symptoms including paresthesias, weakness, ecchymosis. Past Medical History:  Diagnosis Date  . Allergy   . No pertinent past medical history   . Seasonal allergies     Patient Active Problem List   Diagnosis Date Noted  . Foreign body in foot, left 06/01/2012    Past Surgical History:  Procedure Laterality Date  . FOOT SURGERY    . I&D EXTREMITY  06/01/2012   Procedure: IRRIGATION AND DEBRIDEMENT EXTREMITY;  Surgeon: Kathryne Hitchhristopher Y Blackman, MD;  [facility];  Service: Orthopedics;  Laterality: Left;  Removal of glass left foot  . NO PAST SURGERIES          Home Medications    Prior to Admission medications   Medication Sig Start Date End Date Taking? Authorizing Provider  amLODipine (NORVASC) 5 MG tablet Take 10 mg by mouth daily.    Yes [provider]  aspirin EC 81 MG tablet Take 81 mg by mouth daily.   Yes [provider]  cetirizine (ZYRTEC) 10 MG tablet Take 10 mg by mouth daily as needed. Allergies   Yes [provider]  hydrochlorothiazide (MICROZIDE) 12.5 MG capsule Take 12.5 mg by mouth daily.   Yes [provider]  ibuprofen (ADVIL,MOTRIN) 200 MG tablet Take 800 mg by mouth daily as  needed for moderate pain.   Yes [provider]  PRESCRIPTION MEDICATION Place 1 drop into both eyes 2 (two) times daily.   Yes [provider]  erythromycin [facility](ROMYCIN) ophthalmic ointment Place 1 application into the right eye 4 (four) times daily. Patient not taking: Reported on 11/11/2017 02/11/16   Wallis BambergMani, Mario, PA-C    Family History No family history on file.  Social History Social History   Tobacco Use  . Smoking status: Never Smoker  Substance Use Topics  . Alcohol use: Yes    Comment: "social"  . Drug use: No     Allergies   Patient has no known allergies.   Review of Systems Review of Systems  Musculoskeletal: Positive for arthralgias, joint swelling and myalgias.  Skin: Negative for color change.  Neurological: Negative for weakness and numbness.     Physical Exam Updated Vital Signs BP (!) 163/94 (BP Location: Left Arm)   Pulse 80   Temp 98.3 F (36.8 C) (Oral)   Resp 15   Ht 6\' 1"  (1.854 m)   Wt 113.4 kg (250 lb)   SpO2 96%   BMI 32.98 kg/m   Physical Exam  Constitutional: He appears well-developed and well-nourished. No distress.  HENT:  Head: Normocephalic and atraumatic.  Eyes: Right eye exhibits no discharge. Left eye exhibits no discharge. No scleral icterus.  Neck: Normal range of motion.  Cardiovascular: Intact distal  pulses.  Pulmonary/Chest: No respiratory distress.  Musculoskeletal: Normal range of motion.  Patient has pain to palpation of the right fifth metatarsal.  Mild edema is noted.  No significant ecchymosis or erythema is noted.  Patient does have full range of motion of all phalanges of the right foot.  Normal sensation.  Brisk cap refill.  DP pulses are 2+ bilaterally.  There is no pain with palpation over the midfoot.  Patient has full range of motion of the right ankle without pain.  Skin compartments are soft.  Neurological: He is alert.  Skin: Skin is warm and dry. Capillary refill takes less than 2 seconds.  No pallor.  Psychiatric: His behavior is normal. Judgment and thought content normal.  Nursing note and vitals reviewed.    ED Treatments / Results  Labs (all labs ordered are listed, but only abnormal results are displayed) Labs Reviewed - No data to display  EKG None  Radiology Dg Foot Complete Right  Result Date: 11/12/2017 CLINICAL DATA:  52 y/o M; injury of the fifth digit 2 weeks ago with persistent pain. EXAM: RIGHT FOOT COMPLETE - 3+ VIEW COMPARISON:  None. FINDINGS: There is no evidence of fracture or dislocation. Intertarsal productive changes compatible with osteoarthrosis. Talar neck osteophytes may represent anterior ankle impingement in the appropriate clinical setting. Soft tissues are unremarkable. IMPRESSION: 1. No acute fracture or dislocation identified. 2. Intertarsal osteoarthrosis. 3. Talar neck osteophytes may represent anterior ankle impingement in the appropriate clinical setting. Electronically Signed   By: Mitzi Hansen M.D.   On: 11/12/2017 00:34    Procedures Procedures (including critical care time)  Medications Ordered in ED Medications - No data to display   Initial Impression / Assessment and Plan / ED Course  I have reviewed the triage vital signs and the nursing notes.  Pertinent labs & imaging results that were available during my care of the patient were reviewed by me and considered in my medical decision making (see chart for details).     Patient X-Ray negative for obvious fracture or dislocation.  Changes were noted.  Does note talar neck osteophytes the patient has no pain to his ankle or bottom of his foot.  Patient neurovascularly intact. this seems consistent with injury.  Patient given postop shoe and crutches.  Pain managed in ED. Pt advised to follow up with orthopedics if symptoms persist for possibility of missed fracture diagnosis. Patient given brace while in ED, conservative therapy recommended and discussed. Patient  will be dc home & is agreeable with above plan.  Pt is hemodynamically stable, in NAD, & able to ambulate in the ED. Evaluation does not show pathology that would require ongoing emergent intervention or inpatient treatment. I explained the diagnosis to the patient. Pain has been managed & has no complaints prior to dc. Pt is comfortable with above plan and is stable for discharge at this time. All questions were answered prior to disposition. Strict return precautions for f/u to the ED were discussed. Encouraged follow up with PCP.    Final Clinical Impressions(s) / ED Diagnoses   Final diagnoses:  Toe pain, right    ED Discharge Orders    None       Wallace Keller 11/12/17 0053    Rise Mu, PA-C 11/12/17 [id number]    Linwood Dibbles, MD 11/12/17 [phone]

## 2017-11-11 NOTE — ED Triage Notes (Signed)
Patient c/o right fifth toe pain after hitting foot on bed post x3 weeks ago. Ambulatory.

## 2017-11-12 ENCOUNTER — Emergency Department (HOSPITAL_COMMUNITY): Payer: Self-pay

## 2017-11-12 NOTE — Discharge Instructions (Addendum)
Your x-ray did not show any signs of a fracture.  You do have some arthritis in your feet.  I recommend wearing the postop shoe for comfort. Please rest, ice, compress and elevated the affected body part to help with swelling and pain.  May take Tylenol and Motrin.  Please follow directions in the back of the bottle for your pain.  May follow-up with the orthopedist if your symptoms do not improve.

## 2017-11-12 NOTE — ED Notes (Signed)
Patient refused crutches.

## 2020-05-01 ENCOUNTER — Encounter (HOSPITAL_COMMUNITY): Payer: Self-pay

## 2020-05-01 ENCOUNTER — Emergency Department (HOSPITAL_COMMUNITY)
Admission: EM | Admit: 2020-05-01 | Discharge: 2020-05-02 | Disposition: A | Discharge status: Home / Self Care | Payer: Self-pay | Attending: Emergency Medicine | Admitting: Emergency Medicine

## 2020-05-01 ENCOUNTER — Other Ambulatory Visit: Payer: Self-pay

## 2020-05-01 DIAGNOSIS — Z7982 Long term (current) use of aspirin: Secondary | ICD-10-CM | POA: Insufficient documentation

## 2020-05-01 DIAGNOSIS — Z79899 Other long term (current) drug therapy: Secondary | ICD-10-CM | POA: Insufficient documentation

## 2020-05-01 DIAGNOSIS — I10 Essential (primary) hypertension: Secondary | ICD-10-CM | POA: Insufficient documentation

## 2020-05-01 HISTORY — DX: Essential (primary) hypertension: I10

## 2020-05-01 NOTE — ED Triage Notes (Signed)
Patient arrived stating he went to the dentist and they told him his blood pressure was elevated. Patient hypertensive in triage stating that he stopped taking his almodipine because it made his mouth swell. Also has had increased blurred vision over the last few months.

## 2020-05-02 LAB — CBC WITH DIFFERENTIAL/PLATELET
Abs Immature Granulocytes: 0.03 10*3/uL (ref 0.00–0.07)
Basophils Absolute: 0 10*3/uL (ref 0.0–0.1)
Basophils Relative: 1 %
Eosinophils Absolute: 0.1 10*3/uL (ref 0.0–0.5)
Eosinophils Relative: 1 %
HCT: 42.4 % (ref 39.0–52.0)
Hemoglobin: 13.7 g/dL (ref 13.0–17.0)
Immature Granulocytes: 0 %
Lymphocytes Relative: 20 %
Lymphs Abs: 1.7 10*3/uL (ref 0.7–4.0)
MCH: 31.3 pg (ref 26.0–34.0)
MCHC: 32.3 g/dL (ref 30.0–36.0)
MCV: 96.8 fL (ref 80.0–100.0)
Monocytes Absolute: 0.6 10*3/uL (ref 0.1–1.0)
Monocytes Relative: 7 %
Neutro Abs: 6.1 10*3/uL (ref 1.7–7.7)
Neutrophils Relative %: 71 %
Platelets: 284 10*3/uL (ref 150–400)
RBC: 4.38 MIL/uL (ref 4.22–5.81)
RDW: 12.3 % (ref 11.5–15.5)
WBC: 8.6 10*3/uL (ref 4.0–10.5)
nRBC: 0 % (ref 0.0–0.2)

## 2020-05-02 LAB — BASIC METABOLIC PANEL
Anion gap: 9 (ref 5–15)
BUN: 18 mg/dL (ref 6–20)
CO2: 22 mmol/L (ref 22–32)
Calcium: 9 mg/dL (ref 8.9–10.3)
Chloride: 104 mmol/L (ref 98–111)
Creatinine, Ser: 1.23 mg/dL (ref 0.61–1.24)
GFR, Estimated: >60 mL/min (ref >60)
Glucose, Bld: 131 mg/dL — ABNORMAL HIGH (ref 70–99)
Potassium: 4.7 mmol/L (ref 3.5–5.1)
Sodium: 135 mmol/L (ref 135–145)

## 2020-05-02 MED ORDER — HYDROCHLOROTHIAZIDE 12.5 MG PO CAPS
25.0000 mg | ORAL_CAPSULE | Freq: Once | ORAL | Status: AC
  Administered 2020-05-02: 25 mg via ORAL
  Filled 2020-05-02: qty 2

## 2020-05-02 MED ORDER — HYDROCHLOROTHIAZIDE 25 MG PO TABS: 25.0000 mg | ORAL_TABLET | Freq: Every day | ORAL | 0 refills | Status: AC

## 2020-05-02 NOTE — ED Provider Notes (Signed)
[facility]-EMERGENCY DEPT Provider Note   CSN: [id number] Arrival date & time: 05/01/20  2231     History Chief Complaint  Patient presents with  . Hypertension    Jeremy Marsh is a 54 y.o. male.  54 yo M with a chief complaints of hypertension.  This is been an ongoing issue for him.  Has stopped taking his medications sometime earlier this year.  States that amlodipine was making his gums swell.  Has been on antibiotics for that.  Recently saw the dentist for preparation for wisdom tooth removal but was noted to be significantly hypertensive and sent to the ED for evaluation.  He denies any chest pain or trouble breathing.  Has had ongoing issues with his eyesight that seem to come and go.  Is seen an ophthalmologist for this and there is some talk of doing a procedure though his blood pressure was uncontrolled but I am told that he come when that was under better control.  The history is provided by the patient.  Hypertension This is a chronic problem. The current episode started more than 2 days ago. The problem occurs constantly. The problem has not changed since onset.Pertinent negatives include no chest pain, no abdominal pain, no headaches and no shortness of breath. Nothing aggravates the symptoms. Nothing relieves the symptoms. He has tried nothing for the symptoms. The treatment provided no relief.       Past Medical History:  Diagnosis Date  . Allergy   . Hypertension   . No pertinent past medical history   . Seasonal allergies     Patient Active Problem List   Diagnosis Date Noted  . Foreign body in foot, left 06/01/2012    Past Surgical History:  Procedure Laterality Date  . FOOT SURGERY    . I & D EXTREMITY  06/01/2012   Procedure: IRRIGATION AND DEBRIDEMENT EXTREMITY;  Surgeon: Kathryne Hitch, MD;  Location: [facility] OR;  Service: Orthopedics;  Laterality: Left;  Removal of glass left foot  . NO PAST SURGERIES         No family  history on file.  Social History   Tobacco Use  . Smoking status: Never Smoker  Substance Use Topics  . Alcohol use: Yes    Comment: "social"  . Drug use: No    Home Medications Prior to Admission medications   Medication Sig Start Date End Date Taking? Authorizing Provider  amLODipine (NORVASC) 5 MG tablet Take 10 mg by mouth daily.     [provider]  aspirin EC 81 MG tablet Take 81 mg by mouth daily.    [provider]  cetirizine (ZYRTEC) 10 MG tablet Take 10 mg by mouth daily as needed. Allergies    [provider]  erythromycin [facility]) ophthalmic ointment Place 1 application into the right eye 4 (four) times daily. Patient not taking: Reported on 11/11/2017 02/11/16   Wallis Bamberg, PA-C  hydrochlorothiazide (HYDRODIURIL) 25 MG tablet Take 1 tablet (25 mg total) by mouth daily. 05/02/20   Melene Plan, DO  ibuprofen (ADVIL,MOTRIN) 200 MG tablet Take 800 mg by mouth daily as needed for moderate pain.    [provider]  PRESCRIPTION MEDICATION Place 1 drop into both eyes 2 (two) times daily.    [provider]    Allergies    Patient has no known allergies.  Review of Systems   Review of Systems  Constitutional: Negative for chills and fever.  HENT: Negative for congestion and  facial swelling.   Eyes: Positive for visual disturbance. Negative for discharge.  Respiratory: Negative for shortness of breath.   Cardiovascular: Negative for chest pain and palpitations.  Gastrointestinal: Negative for abdominal pain, diarrhea and vomiting.  Musculoskeletal: Negative for arthralgias and myalgias.  Skin: Negative for color change and rash.  Neurological: Negative for tremors, syncope and headaches.  Psychiatric/Behavioral: Negative for confusion and dysphoric mood.    Physical Exam Updated Vital Signs BP (!) 218/114 (BP Location: Left Arm)   Pulse 85   Temp 98.6 F (37 C) (Oral)   Resp (!) 23   SpO2 95%   Physical Exam Vitals  and nursing note reviewed.  Constitutional:      Appearance: He is well-developed.  HENT:     Head: Normocephalic and atraumatic.  Eyes:     Pupils: Pupils are equal, round, and reactive to light.  Neck:     Vascular: No JVD.  Cardiovascular:     Rate and Rhythm: Normal rate and regular rhythm.     Heart sounds: No murmur heard.  No friction rub. No gallop.   Pulmonary:     Effort: No respiratory distress.     Breath sounds: No wheezing.  Abdominal:     General: There is no distension.     Tenderness: There is no guarding or rebound.  Musculoskeletal:        General: Normal range of motion.     Cervical back: Normal range of motion and neck supple.  Skin:    Coloration: Skin is not pale.     Findings: No rash.  Neurological:     Mental Status: He is alert and oriented to person, place, and time.     Cranial Nerves: Cranial nerves are intact.     Sensory: Sensation is intact.     Motor: Motor function is intact.     Comments: Patient has very poor eyesight, unable to do touch my finger with his finger due to inability to see it. Visual field defects in the right upper visual fields.  Psychiatric:        Behavior: Behavior normal.     ED Results / Procedures / Treatments   Labs (all labs ordered are listed, but only abnormal results are displayed) Labs Reviewed  BASIC METABOLIC PANEL - Abnormal; Notable for the following components:      Result Value   Glucose, Bld 131 (*)    All other components within normal limits  CBC WITH DIFFERENTIAL/PLATELET  CBC WITH DIFFERENTIAL/PLATELET    EKG None  Radiology No results found.  Procedures Procedures (including critical care time)  Medications Ordered in ED Medications  hydrochlorothiazide (MICROZIDE) capsule 25 mg (25 mg Oral Given 05/02/20 0158)    ED Course  I have reviewed the triage vital signs and the nursing notes.  Pertinent labs & imaging results that were available during my care of the patient were  reviewed by me and considered in my medical decision making (see chart for details).    MDM Rules/Calculators/A&P                          54 yo M with a chief complaints of hypertension.  This is been a chronic problem for him.  Has been off and on medications.  Did not tolerate Bystolic or amlodipine well and so took himself off.  Has been taking oral over-the-counter supplements to try and help with this.  Has had worsening dental  issues and was evaluated for wisdom tooth removal unfortunately was significantly hypertensive and sent to the ED.  Patient had a significant vision loss though he states this is chronic for him.  Also has a right upper visual field which he states is not new.  I discussed obtaining an MRI to evaluate for a possible stroke patient change was the new problem which he is declining.  She is tachycardic here will obtain basic lab work.  Likely start back on hydrochlorothiazide as he has had that in the past and tolerated it well.  Blood work without significant anemia, renal dysfunction or electrolyte abnormality.  D/c home.   3:41 AM:  I have discussed the diagnosis/risks/treatment options with the patient and believe the pt to be eligible for discharge home to follow-up with PCP. We also discussed returning to the ED immediately if new or worsening sx occur. We discussed the sx which are most concerning (e.g., sudden worsening pain, fever, inability to tolerate by mouth) that necessitate immediate return. Medications administered to the patient during their visit and any new prescriptions provided to the patient are listed below.  Medications given during this visit Medications  hydrochlorothiazide (MICROZIDE) capsule 25 mg (25 mg Oral Given 05/02/20 0158)     The patient appears reasonably screen and/or stabilized for discharge and I doubt any other medical condition or other [facility] requiring further screening, evaluation, or treatment in the ED at this time prior to  discharge.   Final Clinical Impression(s) / ED Diagnoses Final diagnoses:  Primary hypertension    Rx / DC Orders ED Discharge Orders         Ordered    hydrochlorothiazide (HYDRODIURIL) 25 MG tablet  Daily        05/02/20 0152           Melene Plan, DO 05/02/20 [phone]

## 2020-05-02 NOTE — Discharge Instructions (Signed)
Follow-up with your family doctor so they can adjust your blood pressure medications so that you can have your wisdom teeth procedure performed.  Continue to follow-up with your ophthalmologist.  Return for one-sided numbness or weakness difficulty with speech or swallowing or if you start having chest pain or trouble breathing.

## 2020-05-23 IMAGING — CR DG FOOT COMPLETE 3+V*R*
3 series · 3 of 3 positions shown · non-contrast
Comparison: None.

CLINICAL DATA: 51 y/o M; injury of the fifth digit 2 weeks ago with
persistent pain.

EXAM:
RIGHT FOOT COMPLETE - 3+ VIEW

[x foot ap right]
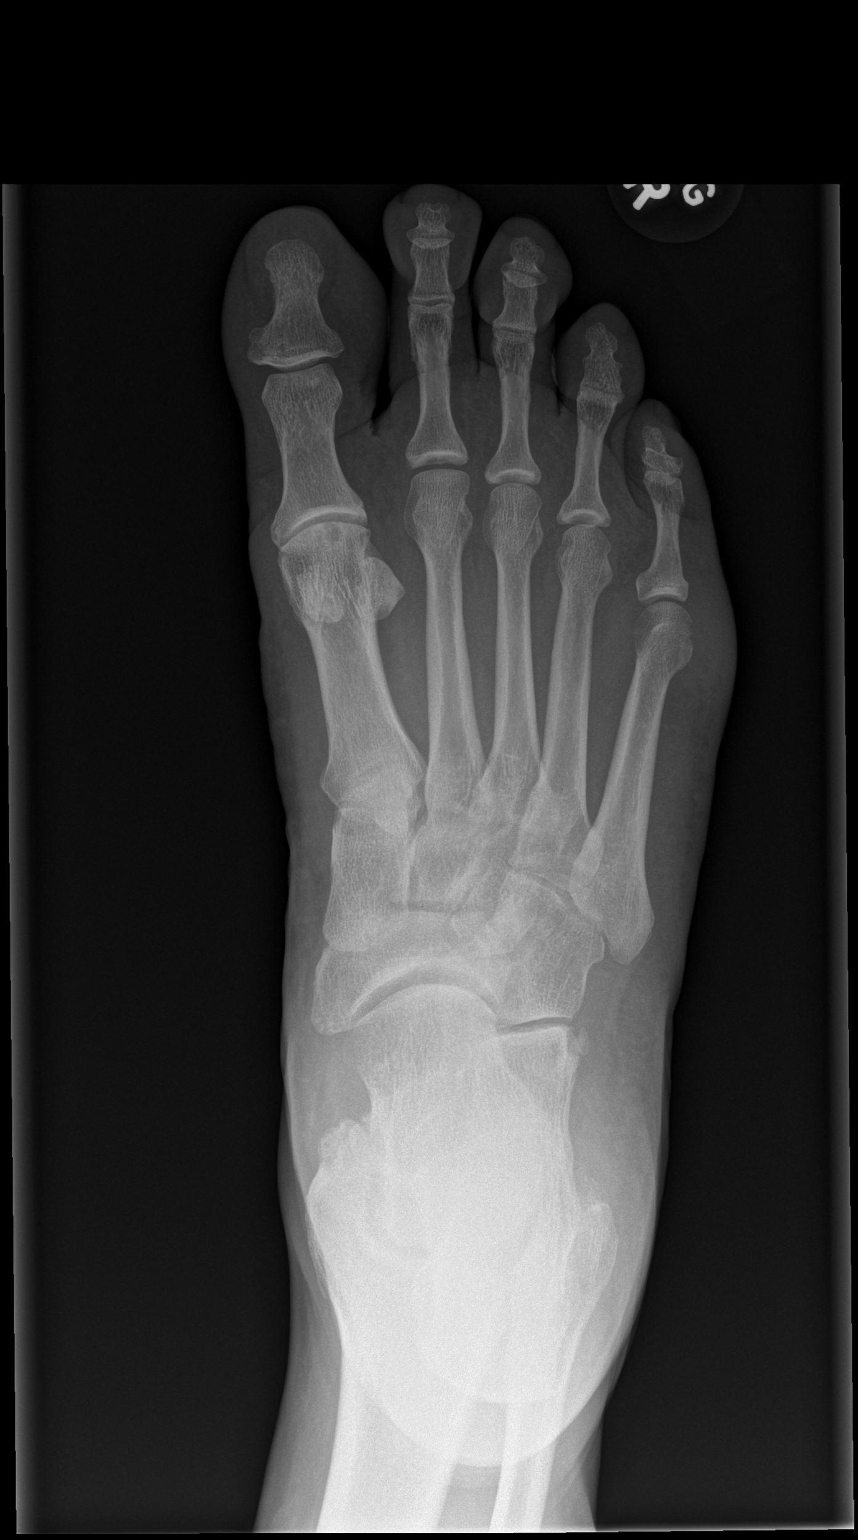

[x foot obl right]
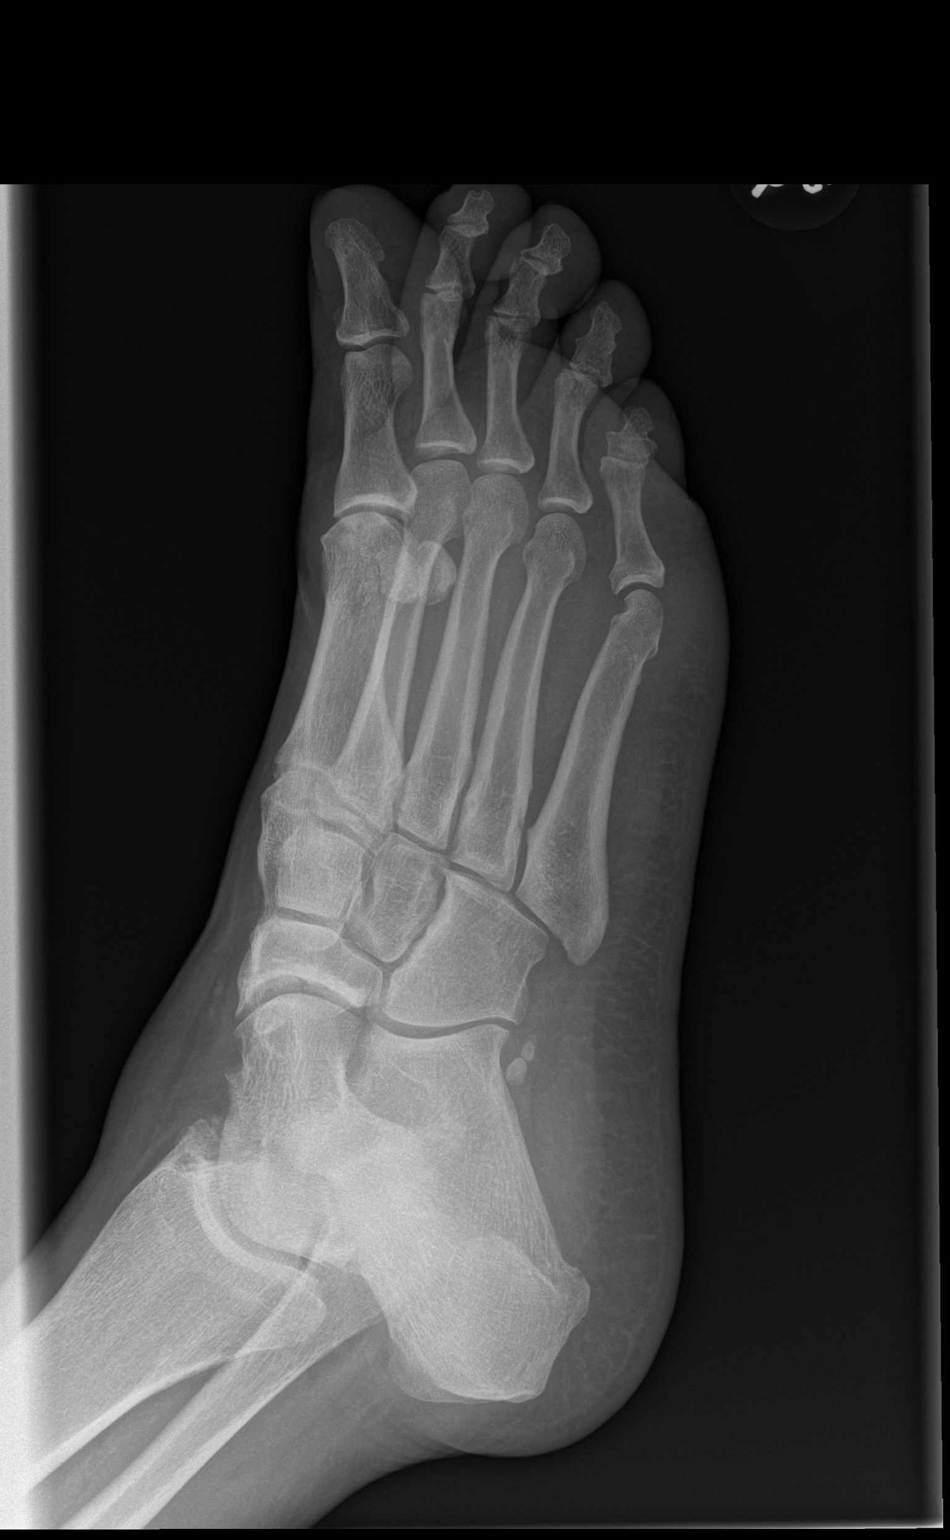

[x foot lat right]
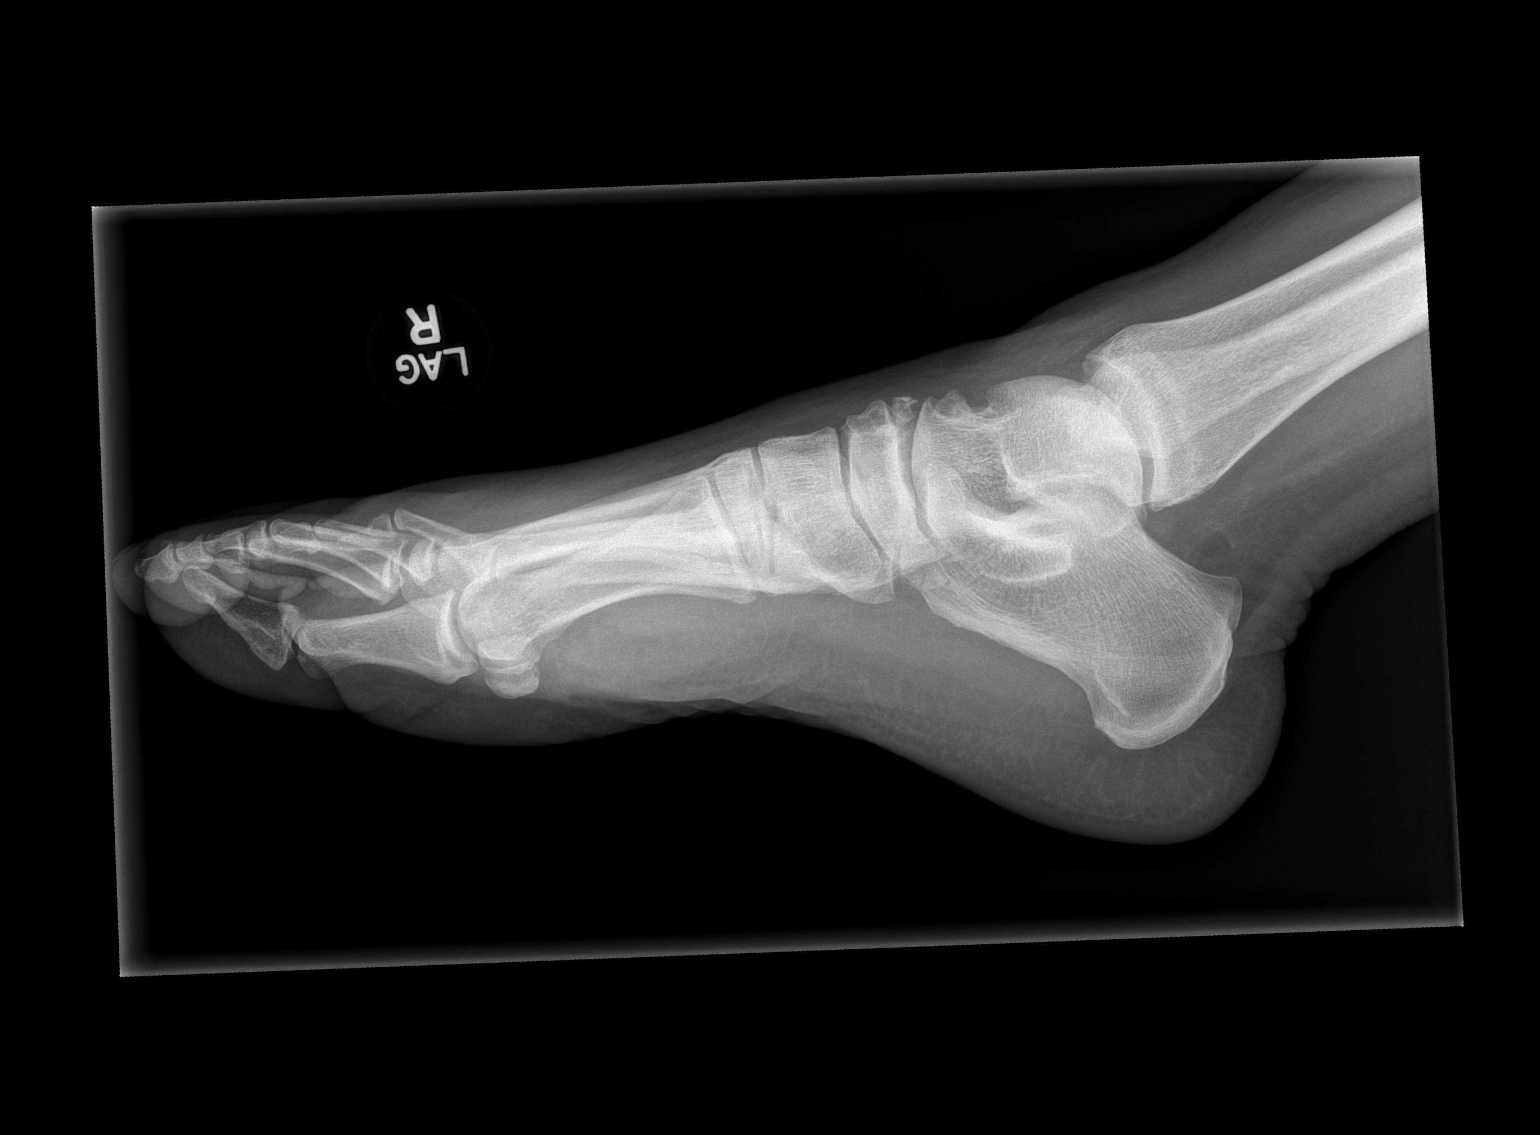

[3 of 3 positions shown; findings below may reference images not displayed]

FINDINGS: There is no evidence of fracture or dislocation. Intertarsal
productive changes compatible with osteoarthrosis. Talar neck
osteophytes may represent anterior ankle impingement in the
appropriate clinical setting. Soft tissues are unremarkable.
IMPRESSION: 1. No acute fracture or dislocation identified.
2. Intertarsal osteoarthrosis.
3. Talar neck osteophytes may represent anterior ankle impingement
in the appropriate clinical setting.

By: Kanzi Mustapha M.D.
